# Patient Record
Sex: Male | Born: 1990 | Race: White | Hispanic: No | Marital: Single | Smoking: Never smoker
Health system: Southern US, Community
[De-identification: ages and names within clinical notes are randomized; demographics above are authoritative.]

## PROBLEM LIST (undated history)

## (undated) HISTORY — PX: KNEE SURGERY: SHX244

---
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
## 2017-07-22 ENCOUNTER — Emergency Department (HOSPITAL_COMMUNITY)
Admission: EM | Admit: 2017-07-22 | Discharge: 2017-07-22 | Disposition: A | Discharge status: Home / Self Care | Payer: BLUE CROSS/BLUE SHIELD | Attending: Emergency Medicine | Admitting: Emergency Medicine

## 2017-07-22 ENCOUNTER — Encounter (HOSPITAL_COMMUNITY): Payer: Self-pay | Admitting: Emergency Medicine

## 2017-07-22 ENCOUNTER — Other Ambulatory Visit: Payer: Self-pay

## 2017-07-22 DIAGNOSIS — Y929 Unspecified place or not applicable: Secondary | ICD-10-CM | POA: Diagnosis not present

## 2017-07-22 DIAGNOSIS — S6992XA Unspecified injury of left wrist, hand and finger(s), initial encounter: Secondary | ICD-10-CM | POA: Diagnosis present

## 2017-07-22 DIAGNOSIS — W260XXA Contact with knife, initial encounter: Secondary | ICD-10-CM | POA: Insufficient documentation

## 2017-07-22 DIAGNOSIS — Y9389 Activity, other specified: Secondary | ICD-10-CM | POA: Insufficient documentation

## 2017-07-22 DIAGNOSIS — S61211A Laceration without foreign body of left index finger without damage to nail, initial encounter: Secondary | ICD-10-CM | POA: Insufficient documentation

## 2017-07-22 DIAGNOSIS — Y999 Unspecified external cause status: Secondary | ICD-10-CM | POA: Insufficient documentation

## 2017-07-22 NOTE — Discharge Instructions (Signed)
Keep the dressing in place for at least 24 hrs.  Then gently remove it and re bandaged.  Keep it covered for 7-10 days.  Clean with mild soap and water.  Return here for any signs of infection

## 2017-07-22 NOTE — ED Provider Notes (Signed)
[facility] EMERGENCY DEPARTMENT Provider Note   CSN: [id number] Arrival date & time: 07/22/17  1047     History   Chief Complaint Chief Complaint  Patient presents with  . Extremity Laceration    HPI Duane AnconaGary R Cabana is a 26 y.o. male.  HPI   Duane Zhang is a 26 y.o. male who presents to the Emergency Department complaining of laceration to the medial aspect of the left index finger that occurred last evening.  He states that he was trying to cut a piece of rope when the knife slipped causing a laceration.  He is clean the wound with soap and water and applied Neosporin, but comes to the emergency room this morning due to continued bleeding.  He applied a pressure dressing last night which temporarily controlled the bleeding.  Complains of a burning throbbing pain to the finger.  He denies weakness, numbness, or pain with movement of the finger.  Denies use of blood thinners.  Tetanus is up-to-date.   History reviewed. No pertinent past medical history.  There are no active problems to display for this patient.   History reviewed. No pertinent surgical history.   Home Medications    Prior to Admission medications   Not on File    Family History History reviewed. No pertinent family history.  Social History Social History   Tobacco Use  . Smoking status: Never Smoker  . Smokeless tobacco: Current User    Types: Snuff  Substance Use Topics  . Alcohol use: Yes    Frequency: Never    Comment: socially drinker  . Drug use: No     Allergies   Patient has no known allergies.   Review of Systems Review of Systems  Constitutional: Negative for chills and fever.  Musculoskeletal: Negative for arthralgias, back pain and joint swelling.  Skin: Positive for wound.       Laceration finger  Neurological: Negative for dizziness, weakness and numbness.  Hematological: Does not bruise/bleed easily.  All other systems reviewed and are negative.    Physical  Exam Updated Vital Signs Pulse 86   Temp 98.3 F (36.8 C) (Oral)   Resp 18   Ht 5\' 7"  (1.702 m)   Wt 94.3 kg (208 lb)   SpO2 100%   BMI 32.58 kg/m   Physical Exam  Constitutional: He is oriented to person, place, and time. He appears well-developed and well-nourished. No distress.  HENT:  Head: Atraumatic.  Cardiovascular: Normal rate, regular rhythm and intact distal pulses.  Pulmonary/Chest: Effort normal and breath sounds normal.  Musculoskeletal: Normal range of motion. He exhibits no edema.       Left hand: He exhibits laceration. He exhibits normal range of motion, no bony tenderness and no swelling. Normal sensation noted. Normal strength noted. He exhibits no finger abduction and no thumb/finger opposition.       Hands: Neurological: He is alert and oriented to person, place, and time. No sensory deficit.  Skin: Skin is warm. Capillary refill takes less than 2 seconds.  4 cm skin avulsion to the radial aspect of the mid left index finger. Mild bleeding.  No edema  Nursing note and vitals reviewed.    ED Treatments / Results  Labs (all labs ordered are listed, but only abnormal results are displayed) Labs Reviewed - No data to display  EKG  EKG Interpretation None       Radiology No results found.  Procedures Procedures (including critical care time)  Medications Ordered  in ED Medications - No data to display   Initial Impression / Assessment and Plan / ED Course  I have reviewed the triage vital signs and the nursing notes.  Pertinent labs & imaging results that were available during my care of the patient were reviewed by me and considered in my medical decision making (see chart for details).     Finger cleaned and hemostasis obtained using a quick clot dressing.  Suture not indicated.  Pt agrees to wound care instructions.  Td UTD.  Return precautions discussed.   Final Clinical Impressions(s) / ED Diagnoses   Final diagnoses:  Laceration of  left index finger without foreign body without damage to nail, initial encounter    ED Discharge Orders    None       Pauline Ausriplett, Unice Vantassel, PA-C 07/22/17 1246    Linwood DibblesKnapp, Jon, MD 07/24/17 [phone]

## 2017-07-22 NOTE — ED Triage Notes (Signed)
Pt cutting rope and cut left index finger.

## 2020-03-23 ENCOUNTER — Other Ambulatory Visit: Payer: Self-pay

## 2020-03-23 ENCOUNTER — Emergency Department (HOSPITAL_COMMUNITY): Payer: Commercial Managed Care - PPO

## 2020-03-23 ENCOUNTER — Encounter (HOSPITAL_COMMUNITY): Payer: Self-pay

## 2020-03-23 ENCOUNTER — Emergency Department (HOSPITAL_COMMUNITY)
Admission: EM | Admit: 2020-03-23 | Discharge: 2020-03-24 | Disposition: A | Discharge status: Home / Self Care | Payer: Commercial Managed Care - PPO | Attending: Emergency Medicine | Admitting: Emergency Medicine

## 2020-03-23 DIAGNOSIS — S82201A Unspecified fracture of shaft of right tibia, initial encounter for closed fracture: Secondary | ICD-10-CM | POA: Diagnosis not present

## 2020-03-23 DIAGNOSIS — S82142A Displaced bicondylar fracture of left tibia, initial encounter for closed fracture: Secondary | ICD-10-CM

## 2020-03-23 DIAGNOSIS — S8992XA Unspecified injury of left lower leg, initial encounter: Secondary | ICD-10-CM | POA: Diagnosis present

## 2020-03-23 DIAGNOSIS — W1789XA Other fall from one level to another, initial encounter: Secondary | ICD-10-CM | POA: Insufficient documentation

## 2020-03-23 DIAGNOSIS — Y9352 Activity, horseback riding: Secondary | ICD-10-CM | POA: Insufficient documentation

## 2020-03-23 DIAGNOSIS — Y9289 Other specified places as the place of occurrence of the external cause: Secondary | ICD-10-CM | POA: Diagnosis not present

## 2020-03-23 DIAGNOSIS — Y999 Unspecified external cause status: Secondary | ICD-10-CM | POA: Insufficient documentation

## 2020-03-23 MED ORDER — OXYCODONE-ACETAMINOPHEN 5-325 MG PO TABS
1.0000 | ORAL_TABLET | Freq: Once | ORAL | Status: AC
  Administered 2020-03-23: 1 via ORAL
  Filled 2020-03-23: qty 1

## 2020-03-23 NOTE — ED Provider Notes (Signed)
[facility] EMERGENCY DEPARTMENT Provider Note   CSN: [id number] Arrival date & time: 03/23/20  2125     History Chief Complaint  Patient presents with  . knee pain    left     Duane Zhang is a 29 y.o. male.  HPI     This is a 29 year old male who presents with a left knee injury.  Patient reports that he was horse playing with another friend when his friend landed on his leg.  He states that his lower leg went one direction and he heard a pop.  He is having 7 out of 10 pain in the left knee.  He has not been ambulatory since that time.  He has not taken anything for his pain.  He denies any other injury.  Reports some tingling in the left foot.  Denies any numbness.  History reviewed. No pertinent past medical history.  There are no problems to display for this patient.   Past Surgical History:  Procedure Laterality Date  . KNEE SURGERY Right        No family history on file.  Social History   Tobacco Use  . Smoking status: Never Smoker  . Smokeless tobacco: Current User    Types: Snuff  Vaping Use  . Vaping Use: Never used  Substance Use Topics  . Alcohol use: Yes    Comment: socially drinker  . Drug use: No    Home Medications Prior to Admission medications   Medication Sig Start Date End Date Taking? Authorizing Provider  methocarbamol (ROBAXIN) 500 MG tablet Take 1 tablet (500 mg total) by mouth 2 (two) times daily. 03/24/20   Farhan Jean, Mayer Masker, MD  oxyCODONE-acetaminophen (PERCOCET/ROXICET) 5-325 MG tablet Take 1 tablet by mouth every 6 (six) hours as needed for severe pain. 03/24/20   Monicia Tse, Mayer Masker, MD    Allergies    Patient has no known allergies.  Review of Systems   Review of Systems  Constitutional: Negative for fever.  Musculoskeletal:       Left knee pain  Neurological: Negative for weakness and numbness.  All other systems reviewed and are negative.   Physical Exam Updated Vital Signs BP 121/78   Pulse 70   Temp 98.6 F (37  C) (Oral)   Resp 16   Ht 1.702 m (5\' 7" )   Wt (!) 102.1 kg   SpO2 99%   BMI 35.24 kg/m   Physical Exam Vitals and nursing note reviewed.  Constitutional:      Appearance: He is well-developed. He is not ill-appearing.  HENT:     Head: Normocephalic and atraumatic.     Nose: Nose normal.     Mouth/Throat:     Mouth: Mucous membranes are moist.  Eyes:     Pupils: Pupils are equal, round, and reactive to light.  Cardiovascular:     Rate and Rhythm: Normal rate and regular rhythm.     Heart sounds: Normal heart sounds. No murmur heard.   Pulmonary:     Effort: Pulmonary effort is normal. No respiratory distress.     Breath sounds: Normal breath sounds. No wheezing.  Abdominal:     Palpations: Abdomen is soft.     Tenderness: There is no abdominal tenderness.  Musculoskeletal:     Cervical back: Neck supple.     Comments: Slight swelling noted over the inferior aspect of the knee, limited range of motion secondary to pain, 2+ DP pulse distally, no obvious deformity  Lymphadenopathy:  Cervical: No cervical adenopathy.  Skin:    General: Skin is warm and dry.  Neurological:     Mental Status: He is alert and oriented to person, place, and time.  Psychiatric:        Mood and Affect: Mood normal.     ED Results / Procedures / Treatments   Labs (all labs ordered are listed, but only abnormal results are displayed) Labs Reviewed  URINALYSIS, ROUTINE W REFLEX MICROSCOPIC    EKG None  Radiology CT Knee Left Wo Contrast  Result Date: 03/23/2020 CLINICAL DATA:  Twisted knee, audible pop EXAM: CT OF THE LEFT KNEE WITHOUT CONTRAST TECHNIQUE: Multidetector CT imaging of the LEFT knee was performed according to the standard protocol. Multiplanar CT image reconstructions were also generated. COMPARISON:  Radiograph 03/23/2020 FINDINGS: Bones/Joint/Cartilage Complex comminuted though minimally displaced proximal tibial fracture includes a transverse tibial metadiaphyseal  fracture component as well as wedge shaped fractures of both the medial and lateral tibial plateau extending into the mesial weight-bearing portion of the medial plateau and both the medial and central weight-bearing portions of the lateral tibial plateau with minimal significant cortical step-off. 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 Additional nondisplaced fracture of the fibular styloid is present as well which may imply superimposed posterolateral corner instability given involvement at the attachment site of the arcuate complex. Associated lipohemarthrosis. Corticated spurring along the inferior pole patella, possibly enthesopathic in nature. Additional mild spurring noted upon the tibial spines. Ligaments Suboptimally assessed by CT. There is irregular thickening and heterogeneity of the anterior cruciate ligament. While the posterior cruciate appears grossly intact. Fracture lines do extend in close proximity to the footprint upon the posterior tibia. The nondisplaced fracture involving the fibular styloid may imply superimposed posterolateral corner instability/involvement of the arcuate ligament. Some mild thickening is present about the distal patellar tendon with fracture involvement near the tibial insertion. Muscles and Tendons No discrete musculotendinous discontinuity nor retracted/torn tendons are seen. Distal quadriceps tendon appears intact. Soft tissues Soft tissue swelling of the knee most pronounced anteriorly with some associated skin thickening and stranding anterior to the extensor mechanism. Additional swelling and thickening adjacent the proximal fibula and about the comminuted fracture lines of the tibial plateau. IMPRESSION: 1. Complex comminuted though minimally displaced proximal tibial fracture includes a transverse tibial metadiaphyseal fracture component as well as wedge shaped fractures of both the medial and lateral tibial plateau  compatible with a Schatzker type VI injury. Articular surface involvement of both medial and lateral tibial plateau with minimal cortical step-off. 2. Additional nondisplaced fracture of the fibular styloid is present as well which may imply superimposed posterolateral corner instability with likely involvement of the arcuate ligament. 3. Irregular thickening and heterogeneity of the anterior cruciate ligament. 4. Thickening is present about the distal patellar tendon with fracture involvement near the tibial insertion. 5. Suprapatellar lipohemarthrosis. 6. Soft tissue swelling of the knee most pronounced anteriorly. Electronically Signed   By: Kreg Shropshire M.D.   On: 03/23/2020 23:59   DG Knee Complete 4 Views Left  Result Date: 03/23/2020 CLINICAL DATA:  Recent twisting injury with pain, initial encounter EXAM: LEFT KNEE - COMPLETE 4+ VIEW COMPARISON:  None. FINDINGS: The comminuted fracture of the proximal tibial metaphysis is noted. Intra-articular involvement is not visualized on these films although a fat fluid effusion in the knee joint is noted suggesting articular involvement. No other focal abnormality is seen. IMPRESSION: Comminuted proximal tibial fracture with fat fluid effusion in  the knee joint. Electronically Signed   By: Alcide Clever M.D.   On: 03/23/2020 22:48    Procedures Procedures (including critical care time)  Medications Ordered in ED Medications  oxyCODONE-acetaminophen (PERCOCET/ROXICET) 5-325 MG per tablet 1 tablet (1 tablet Oral Given 03/23/20 2321)  methocarbamol (ROBAXIN) tablet 500 mg (500 mg Oral Given 03/24/20 0104)  oxyCODONE-acetaminophen (PERCOCET/ROXICET) 5-325 MG per tablet 1 tablet (1 tablet Oral Given 03/24/20 0104)    ED Course  I have reviewed the triage vital signs and the nursing notes.  Pertinent labs & imaging results that were available during my care of the patient were reviewed by me and considered in my medical decision making (see chart for  details).    MDM Rules/Calculators/A&P                           Patient presents with a left knee injury.  Limited range of motion on exam.  Appears to have an effusion.  Patient given pain medication.  X-rays from triage concerning for proximal tibial fracture.  Will obtain CT for further characterization.  CT scan confirms tibial plateau fracture as well as fibular styloid fracture.  He likely also has ligamentous injury.  Patient was placed in a knee immobilizer.  He was instructed to be nonweightbearing.  I discussed patient with Dr. Romeo Apple who will likely refer to trauma Ortho; however, recommends follow-up closely in the office later this week.  I discussed this with patient who is in agreement with plan.  He was given an pain medication at discharge.  After history, exam, and medical workup I feel the patient has been appropriately medically screened and is safe for discharge home. Pertinent diagnoses were discussed with the patient. Patient was given return precautions.   Final Clinical Impression(s) / ED Diagnoses Final diagnoses:  Closed fracture of left tibial plateau, initial encounter    Rx / DC Orders ED Discharge Orders         Ordered    oxyCODONE-acetaminophen (PERCOCET/ROXICET) 5-325 MG tablet  Every 6 hours PRN     Discontinue  Reprint     03/24/20 0051    methocarbamol (ROBAXIN) 500 MG tablet  2 times daily     Discontinue  Reprint     03/24/20 0051           Shon Baton, MD 03/24/20 [phone]

## 2020-03-23 NOTE — ED Triage Notes (Signed)
Pt  horseplay with a friend and twisted knee and pop heard per pt. Ice applied at this time.

## 2020-03-24 MED ORDER — OXYCODONE-ACETAMINOPHEN 5-325 MG PO TABS
1.0000 | ORAL_TABLET | Freq: Once | ORAL | Status: AC
  Administered 2020-03-24: 1 via ORAL
  Filled 2020-03-24: qty 1

## 2020-03-24 MED ORDER — METHOCARBAMOL 500 MG PO TABS
500.0000 mg | ORAL_TABLET | Freq: Two times a day (BID) | ORAL | 0 refills | Status: AC
Start: 2020-03-24 — End: ?

## 2020-03-24 MED ORDER — OXYCODONE-ACETAMINOPHEN 5-325 MG PO TABS: 1.0000 | ORAL_TABLET | Freq: Four times a day (QID) | ORAL | 0 refills | Status: AC | PRN

## 2020-03-24 MED ORDER — METHOCARBAMOL 500 MG PO TABS
500.0000 mg | ORAL_TABLET | Freq: Once | ORAL | Status: AC
  Administered 2020-03-24: 500 mg via ORAL
  Filled 2020-03-24: qty 1

## 2020-03-24 NOTE — Discharge Instructions (Addendum)
You were seen today for a knee injury.  You have evidence of a tibial plateau fracture.  Maintain the knee immobilizer.  Do not bear weight.  Follow-up with orthopedics as instructed.  Take medications as prescribed.  Do not drive while taking pain medications.

## 2021-06-13 IMAGING — CT CT KNEE*L* W/O CM
3 of 4 series · 13 of 33 positions shown, 16 images · non-contrast
Comparison: Radiograph 03/23/2020

CLINICAL DATA: Twisted knee, audible pop

EXAM:
CT OF THE LEFT KNEE WITHOUT CONTRAST
TECHNIQUE: Multidetector CT imaging of the LEFT knee was performed according to
the standard protocol. Multiplanar CT image reconstructions were
also generated.

[Series 4: axial bone · axial · 0.34mm/px · z∈[+664,+783]mm · 5 of 182 slices shown, 7 images]
[im 31/182  soft-tissue]
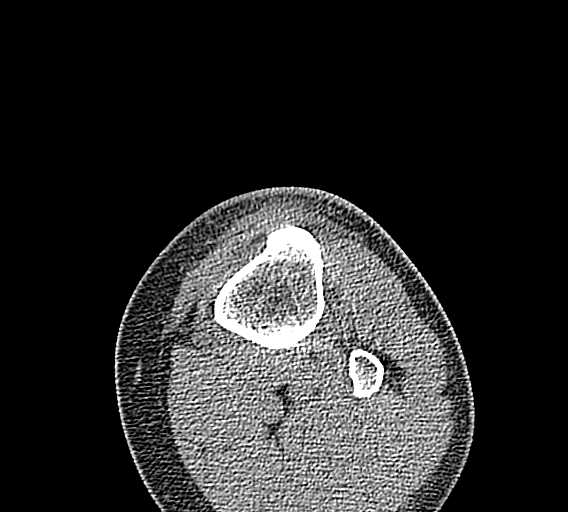
[im 31/182  bone]
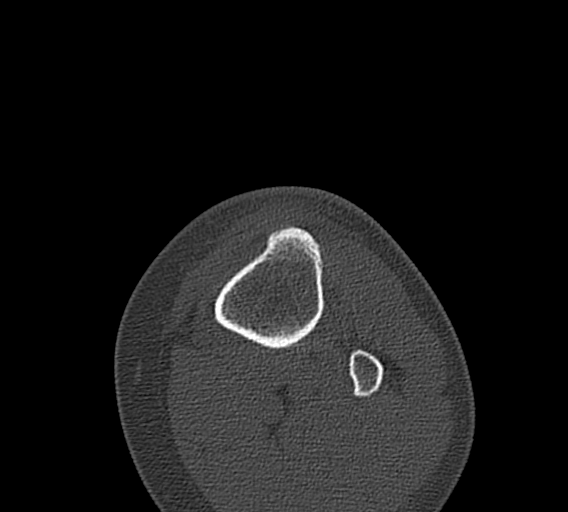
[im 61/182  bone]
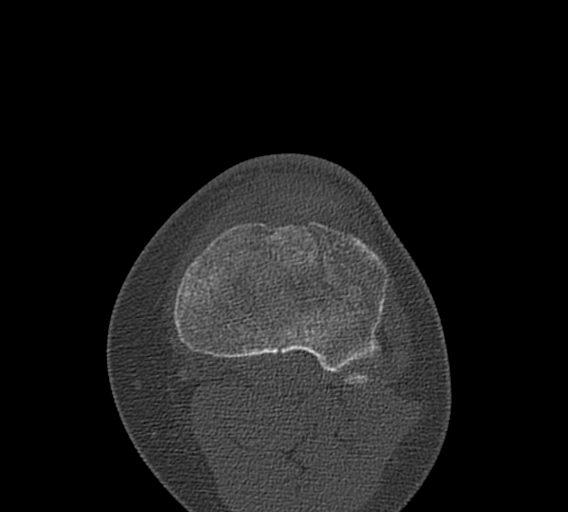
[im 91/182  bone]
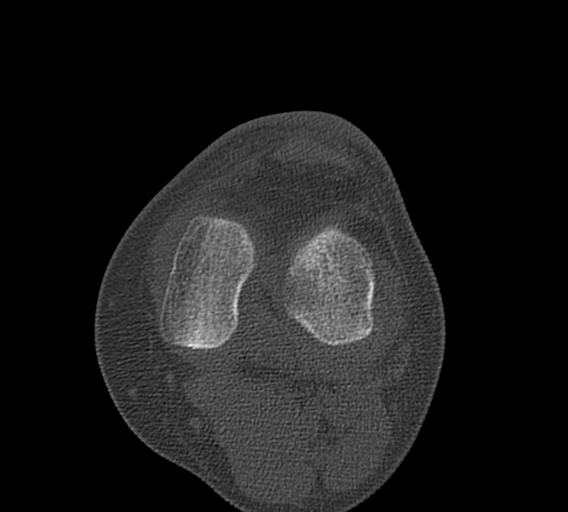
[im 121/182  bone]
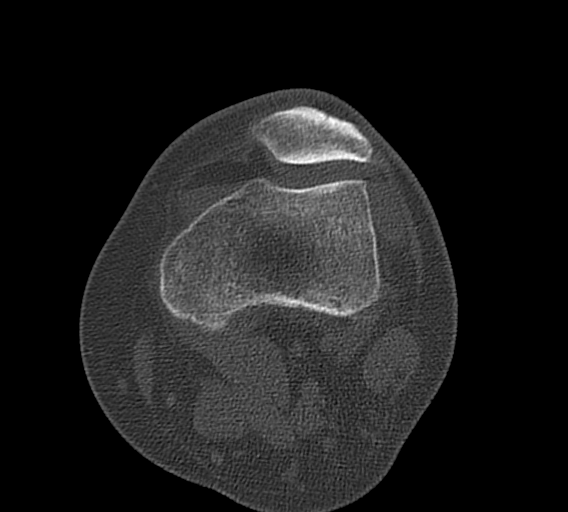
[im 151/182  soft-tissue]
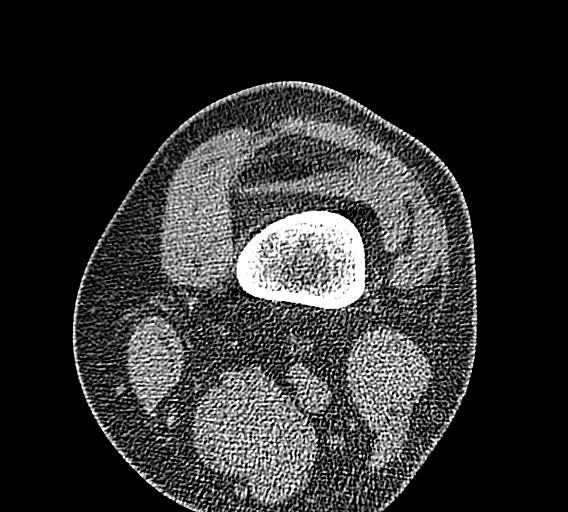
[im 151/182  bone]
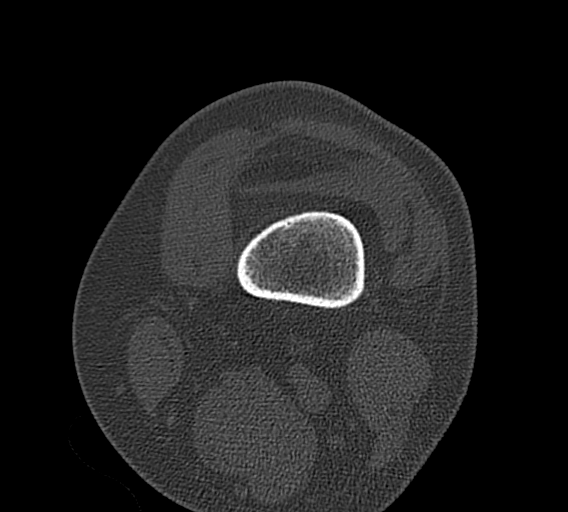

[Series 8: cor st · coronal · 0.36mm/px · 3 of 215 slices shown]
[im 43/215  bone]
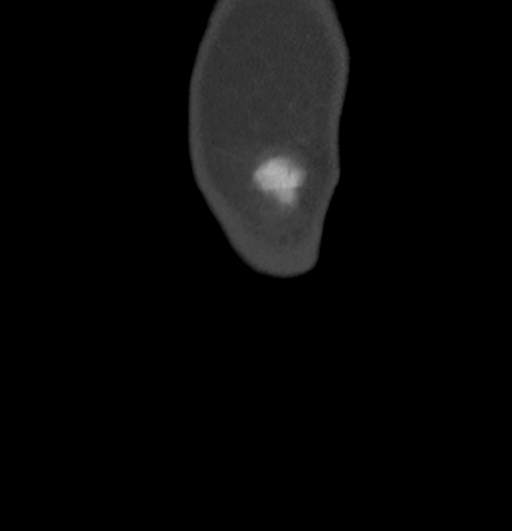
[im 86/215  bone]
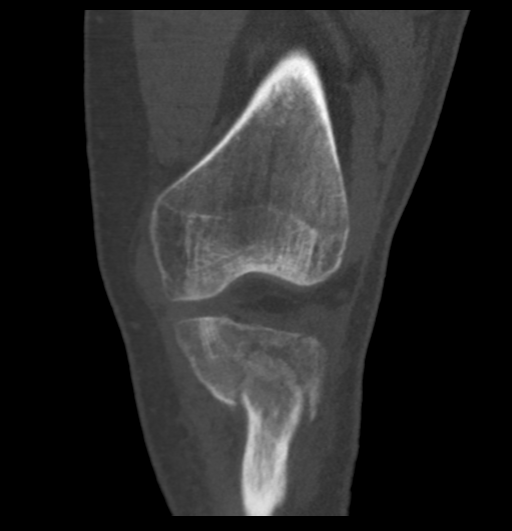
[im 129/215  bone]
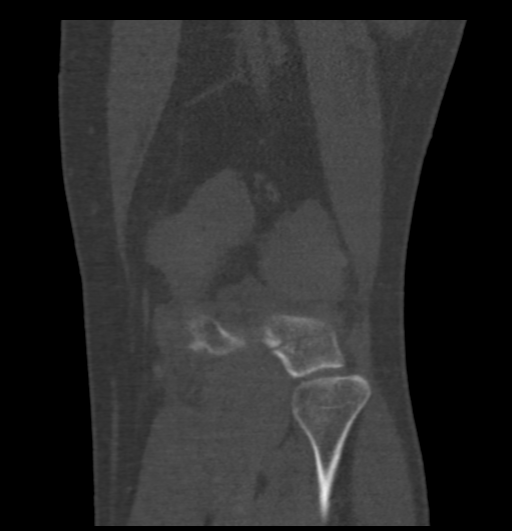

[Series 9: sag st · sagittal · 0.36mm/px · 5 of 175 slices shown, 6 images]
[im 59/175  bone]
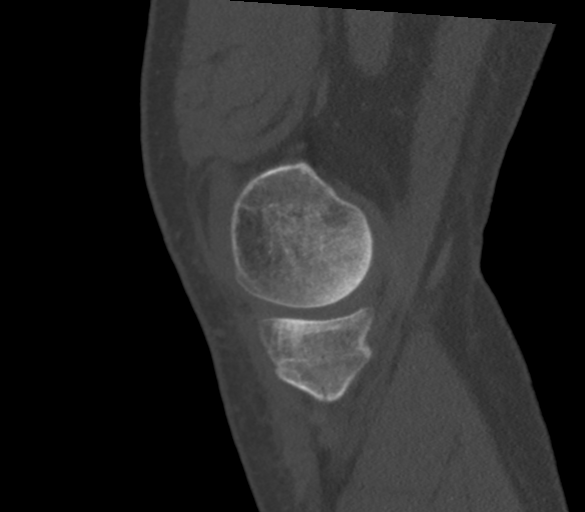
[im 73/175  bone]
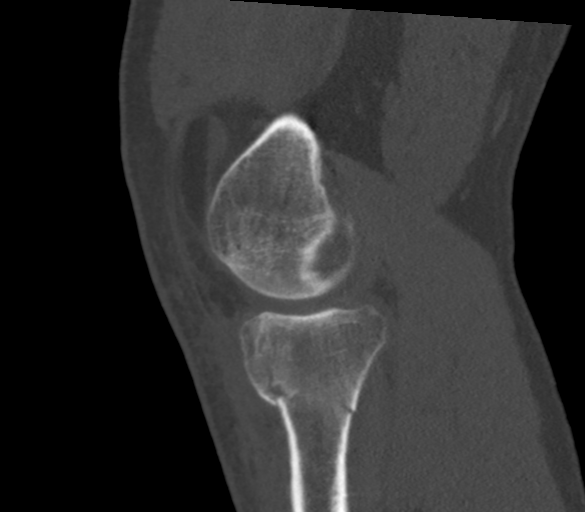
[im 88/175  soft-tissue]
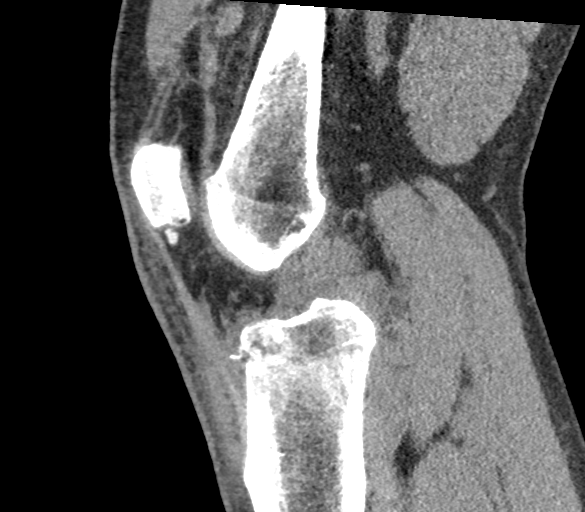
[im 88/175  bone]
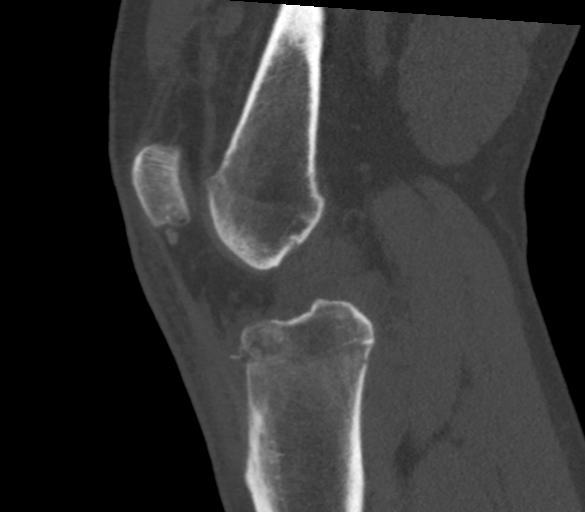
[im 102/175  bone]
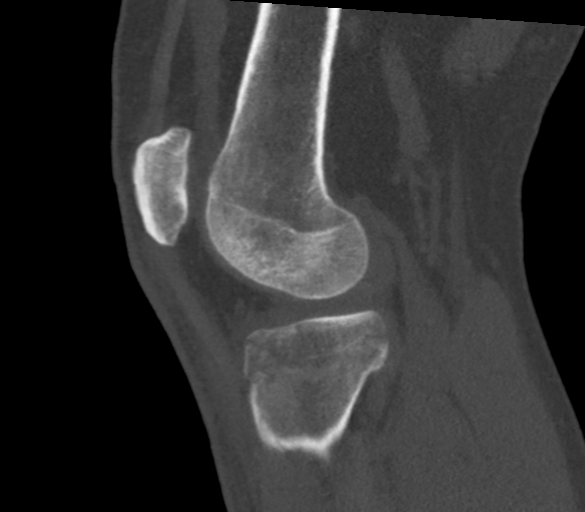
[im 117/175  bone]
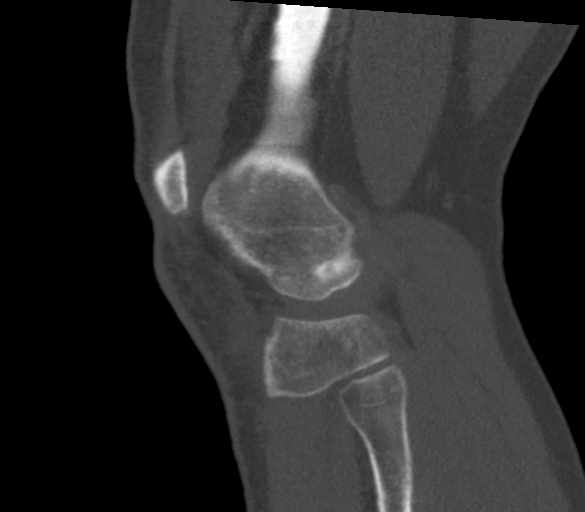

[13 of 33 positions shown; findings below may reference images not displayed]

FINDINGS: Bones/Joint/Cartilage

Complex comminuted though minimally displaced proximal tibial
fracture includes a transverse tibial metadiaphyseal fracture
component as well as wedge shaped fractures of both the medial and
lateral tibial plateau extending into the mesial weight-bearing
portion of the medial plateau and both the medial and central
weight-bearing portions of the lateral tibial plateau with minimal
significant cortical step-off. Constellation of fracture components
is most compatible with a Schatzker type VI injury. Additional
nondisplaced fracture of the fibular styloid is present as well
which may imply superimposed posterolateral corner instability given
involvement at the attachment site of the arcuate complex.
Associated lipohemarthrosis. Corticated spurring along the inferior
pole patella, possibly enthesopathic in nature. Additional mild
spurring noted upon the tibial spines.

Ligaments

Suboptimally assessed by CT. There is irregular thickening and
heterogeneity of the anterior cruciate ligament. While the posterior
cruciate appears grossly intact. Fracture lines do extend in close
proximity to the footprint upon the posterior tibia. The
nondisplaced fracture involving the fibular styloid may imply
superimposed posterolateral corner instability/involvement of the
arcuate ligament. Some mild thickening is present about the distal
patellar tendon with fracture involvement near the tibial insertion.

Muscles and Tendons

No discrete musculotendinous discontinuity nor retracted/torn
tendons are seen. Distal quadriceps tendon appears intact.

Soft tissues

Soft tissue swelling of the knee most pronounced anteriorly with
some associated skin thickening and stranding anterior to the
extensor mechanism. Additional swelling and thickening adjacent the
proximal fibula and about the comminuted fracture lines of the
tibial plateau.
IMPRESSION: 1. Complex comminuted though minimally displaced proximal tibial
fracture includes a transverse tibial metadiaphyseal fracture
component as well as wedge shaped fractures of both the medial and
lateral tibial plateau compatible with a Schatzker type VI injury.
Articular surface involvement of both medial and lateral tibial
plateau with minimal cortical step-off.
2. Additional nondisplaced fracture of the fibular styloid is
present as well which may imply superimposed posterolateral corner
instability with likely involvement of the arcuate ligament.
3. Irregular thickening and heterogeneity of the anterior cruciate
ligament.
4. Thickening is present about the distal patellar tendon with
fracture involvement near the tibial insertion.
5. Suprapatellar lipohemarthrosis.
6. Soft tissue swelling of the knee most pronounced anteriorly.

## 2024-02-10 ENCOUNTER — Encounter: Payer: Self-pay | Admitting: Nurse Practitioner

## 2024-02-10 ENCOUNTER — Ambulatory Visit (INDEPENDENT_AMBULATORY_CARE_PROVIDER_SITE_OTHER): Admitting: Nurse Practitioner

## 2024-02-10 VITALS — BP 126/84 | HR 83 | Ht 67.0 in | Wt 252.0 lb

## 2024-02-10 DIAGNOSIS — G4719 Other hypersomnia: Secondary | ICD-10-CM | POA: Diagnosis not present

## 2024-02-10 DIAGNOSIS — E669 Obesity, unspecified: Secondary | ICD-10-CM | POA: Insufficient documentation

## 2024-02-10 DIAGNOSIS — R0683 Snoring: Secondary | ICD-10-CM | POA: Diagnosis not present

## 2024-02-10 DIAGNOSIS — Z6830 Body mass index (BMI) 30.0-30.9, adult: Secondary | ICD-10-CM | POA: Diagnosis not present

## 2024-02-10 NOTE — Assessment & Plan Note (Signed)
 He has snoring, excessive daytime sleepiness, nocturnal apneic events, morning headaches, restless sleep. BMI 39. Epworth 21. Given this,  I am concerned he could have sleep disordered breathing with obstructive sleep apnea. He will need sleep study for further evaluation.    - discussed how weight can impact sleep and risk for sleep disordered breathing - discussed options to assist with weight loss: combination of diet modification, cardiovascular and strength training exercises   - had an extensive discussion regarding the adverse health consequences related to untreated sleep disordered breathing - specifically discussed the risks for hypertension, coronary artery disease, cardiac dysrhythmias, cerebrovascular disease, and diabetes - lifestyle modification discussed   - discussed how sleep disruption can increase risk of accidents, particularly when driving - safe driving practices were discussed - advised to avoid driving until symptoms addressed/improved  Patient Instructions  Given your symptoms, I am concerned that you may have sleep disordered breathing with sleep apnea. You will need a sleep study for further evaluation. Someone will contact you to schedule this.   We discussed how untreated sleep apnea puts an individual at risk for cardiac arrhthymias, pulm HTN, DM, stroke and increases their risk for daytime accidents. We also briefly reviewed treatment options including weight loss, side sleeping position, oral appliance, CPAP therapy or referral to ENT for possible surgical options  Use caution when driving and pull over if you become sleepy.  Follow up in 6 weeks with Katie Jhase Creppel,NP to go over sleep study results, or sooner, if needed. Friday PM virtual clinic preferred

## 2024-02-10 NOTE — Patient Instructions (Signed)
 Given your symptoms, I am concerned that you may have sleep disordered breathing with sleep apnea. You will need a sleep study for further evaluation. Someone will contact you to schedule this.   We discussed how untreated sleep apnea puts an individual at risk for cardiac arrhthymias, pulm HTN, DM, stroke and increases their risk for daytime accidents. We also briefly reviewed treatment options including weight loss, side sleeping position, oral appliance, CPAP therapy or referral to ENT for possible surgical options  Use caution when driving and pull over if you become sleepy.  Follow up in 6 weeks with Katie Emmanuell Kantz,NP to go over sleep study results, or sooner, if needed. Friday PM virtual clinic preferred
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
## 2024-02-10 NOTE — Assessment & Plan Note (Signed)
 See above

## 2024-02-10 NOTE — Assessment & Plan Note (Signed)
BMI 39. Healthy weight loss encouraged.

## 2024-02-11 ENCOUNTER — Telehealth: Payer: Self-pay

## 2024-02-11 NOTE — Telephone Encounter (Signed)
 Hst is under referral. Order faxed to [facility] at [phone]. Faxed, was successful.  Reason for CRM: Elias Gros from [facility] is requesting the order for a home sleep test to be faxed to [phone]. She stated the phrase home sleep test must be on the referral.

## 2024-03-08 ENCOUNTER — Encounter

## 2024-03-08 DIAGNOSIS — G4719 Other hypersomnia: Secondary | ICD-10-CM

## 2024-03-08 DIAGNOSIS — R0683 Snoring: Secondary | ICD-10-CM

## 2024-03-22 DIAGNOSIS — R069 Unspecified abnormalities of breathing: Secondary | ICD-10-CM | POA: Diagnosis not present

## 2024-04-07 ENCOUNTER — Ambulatory Visit: Payer: Self-pay | Admitting: Nurse Practitioner

## 2024-04-18 ENCOUNTER — Ambulatory Visit: Admitting: Nurse Practitioner

## 2024-04-18 ENCOUNTER — Encounter: Payer: Self-pay | Admitting: Nurse Practitioner

## 2024-04-18 VITALS — BP 151/93 | HR 86 | Temp 98.7°F | Ht 67.0 in | Wt 258.6 lb

## 2024-04-18 DIAGNOSIS — G4719 Other hypersomnia: Secondary | ICD-10-CM

## 2024-04-18 DIAGNOSIS — G4733 Obstructive sleep apnea (adult) (pediatric): Secondary | ICD-10-CM | POA: Diagnosis not present

## 2024-04-18 DIAGNOSIS — Z6841 Body Mass Index (BMI) 40.0 and over, adult: Secondary | ICD-10-CM | POA: Diagnosis not present

## 2024-04-18 NOTE — Patient Instructions (Addendum)
 Start CPAP auto 5-20 cmH2O every night, minimum of 4-6 hours a night.  Change equipment as directed. Wash your tubing with warm soap and water daily, hang to dry. Wash humidifier portion weekly. Use bottled, distilled water and change daily Be aware of reduced alertness and do not drive or operate heavy machinery if experiencing this or drowsiness.  Exercise encouraged, as tolerated. Healthy weight management discussed.  Avoid or decrease alcohol consumption and medications that make you more sleepy, if possible. Notify if persistent daytime sleepiness occurs even with consistent use of PAP therapy.  Change CPAP supplies... Every month Mask cushions and/or nasal pillows CPAP machine filters Every 3 months Mask frame (not including the headgear) CPAP tubing Every 6 months Mask headgear Chin strap (if applicable) Humidifier water tub   We discussed how untreated sleep apnea puts an individual at risk for cardiac arrhthymias, pulm HTN, DM, stroke and increases their risk for daytime accidents. We also briefly reviewed treatment options including weight loss, side sleeping position, oral appliance, CPAP therapy or referral to ENT for possible surgical options  Follow up in 10-12 weeks with Katie Surya Folden,NP. If symptoms do not improve or worsen, please contact office for sooner follow up

## 2024-04-18 NOTE — Assessment & Plan Note (Signed)
 Severe OSA. Discussed results and risks of untreated severe OSA. Recommendation made for CPAP therapy given severity. Urgent order placed for new start CPAP 5-20 cmH2O, mask of choice and heated humidity. Proper care/use of device reviewed. Risks/benefits discussed. Healthy weight management encouraged. Reviewed correlation between obesity and OSA. Safe driving practices reviewed.  Patient Instructions  Start CPAP auto 5-20 cmH2O every night, minimum of 4-6 hours a night.  Change equipment as directed. Wash your tubing with warm soap and water daily, hang to dry. Wash humidifier portion weekly. Use bottled, distilled water and change daily Be aware of reduced alertness and do not drive or operate heavy machinery if experiencing this or drowsiness.  Exercise encouraged, as tolerated. Healthy weight management discussed.  Avoid or decrease alcohol consumption and medications that make you more sleepy, if possible. Notify if persistent daytime sleepiness occurs even with consistent use of PAP therapy.  Change CPAP supplies... Every month Mask cushions and/or nasal pillows CPAP machine filters Every 3 months Mask frame (not including the headgear) CPAP tubing Every 6 months Mask headgear Chin strap (if applicable) Humidifier water tub   We discussed how untreated sleep apnea puts an individual at risk for cardiac arrhthymias, pulm HTN, DM, stroke and increases their risk for daytime accidents. We also briefly reviewed treatment options including weight loss, side sleeping position, oral appliance, CPAP therapy or referral to ENT for possible surgical options  Follow up in 10-12 weeks with Duane Leeann Bady,NP. If symptoms do not improve or worsen, please contact office for sooner follow up

## 2024-04-18 NOTE — Assessment & Plan Note (Signed)
 BMI 40. Healthy weight management
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
## 2024-04-26 ENCOUNTER — Ambulatory Visit: Admitting: Nurse Practitioner

## 2024-06-27 ENCOUNTER — Encounter: Payer: Self-pay | Admitting: Nurse Practitioner

## 2024-06-27 ENCOUNTER — Ambulatory Visit: Admitting: Nurse Practitioner

## 2024-06-27 VITALS — BP 123/81 | HR 85 | Temp 98.7°F | Ht 67.0 in | Wt 256.6 lb

## 2024-06-27 DIAGNOSIS — G4733 Obstructive sleep apnea (adult) (pediatric): Secondary | ICD-10-CM

## 2024-06-27 NOTE — Assessment & Plan Note (Addendum)
 BMI 40. Healthy weight management encouraged. Not interested in pharmacological therapy at this time.
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
## 2024-06-27 NOTE — Assessment & Plan Note (Signed)
 Severe OSA. Aware of risks of untreated severe OSA. Shared decision to start PAP therapy at prior visit. Doing well on this. Excellent compliance and control. Receiving benefit from use. Aware of proper care/use of device reviewed. Healthy weight management encouraged. Reviewed correlation between obesity and OSA. Safe driving practices reviewed.  Patient Instructions  Continue to use CPAP every night, minimum of 4-6 hours a night.  Change equipment as directed. Wash your tubing with warm soap and water daily, hang to dry. Wash humidifier portion weekly. Use bottled, distilled water and change daily Be aware of reduced alertness and do not drive or operate heavy machinery if experiencing this or drowsiness.  Exercise encouraged, as tolerated. Healthy weight management discussed.  Avoid or decrease alcohol consumption and medications that make you more sleepy, if possible. Notify if persistent daytime sleepiness occurs even with consistent use of PAP therapy.  Change CPAP supplies... Every month Mask cushions and/or nasal pillows CPAP machine filters Every 3 months Mask frame (not including the headgear) CPAP tubing Every 6 months Mask headgear Chin strap (if applicable) Humidifier water tub  Follow up in 6 months with Katie Leverne Amrhein,NP, or sooner, if needed

## 2024-06-27 NOTE — Patient Instructions (Signed)
 Continue to use CPAP every night, minimum of 4-6 hours a night.  Change equipment as directed. Wash your tubing with warm soap and water daily, hang to dry. Wash humidifier portion weekly. Use bottled, distilled water and change daily Be aware of reduced alertness and do not drive or operate heavy machinery if experiencing this or drowsiness.  Exercise encouraged, as tolerated. Healthy weight management discussed.  Avoid or decrease alcohol consumption and medications that make you more sleepy, if possible. Notify if persistent daytime sleepiness occurs even with consistent use of PAP therapy.  Change CPAP supplies... Every month Mask cushions and/or nasal pillows CPAP machine filters Every 3 months Mask frame (not including the headgear) CPAP tubing Every 6 months Mask headgear Chin strap (if applicable) Humidifier water tub  Follow up in 6 months with Duane Deshan Hemmelgarn,NP, or sooner, if needed
